# Patient Record
Sex: Male | Born: 2000 | Race: White | Hispanic: No | Marital: Single | Smoking: Never smoker
Health system: Southern US, Community
[De-identification: ages and names within clinical notes are randomized; demographics above are authoritative.]

---
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
## 2021-11-09 ENCOUNTER — Encounter (HOSPITAL_COMMUNITY): Payer: Self-pay | Admitting: Emergency Medicine

## 2021-11-09 ENCOUNTER — Ambulatory Visit (HOSPITAL_COMMUNITY)
Admission: EM | Admit: 2021-11-09 | Discharge: 2021-11-09 | Disposition: A | Discharge status: Home / Self Care | Payer: Managed Care, Other (non HMO) | Attending: Internal Medicine | Admitting: Internal Medicine

## 2021-11-09 ENCOUNTER — Ambulatory Visit (INDEPENDENT_AMBULATORY_CARE_PROVIDER_SITE_OTHER): Payer: Managed Care, Other (non HMO)

## 2021-11-09 DIAGNOSIS — S92352A Displaced fracture of fifth metatarsal bone, left foot, initial encounter for closed fracture: Secondary | ICD-10-CM | POA: Diagnosis not present

## 2021-11-09 NOTE — ED Provider Notes (Signed)
[facility]    CSN: [id number] Arrival date & time: 11/09/21  1449      History   Chief Complaint Chief Complaint  Patient presents with   Foot Pain    HPI Bradley Becker is a 21 y.o. male.   Patient presents with left foot pain after injury that occurred yesterday.  Patient states he tried to jump over his friend when he landed incorrectly on his foot.  Having pain in the lateral portion of the foot.  He is able to bear weight.  Has taken Tylenol for pain with minimal improvement.  Denies any numbness or tingling.   Foot Pain    History reviewed. No pertinent past medical history.  There are no problems to display for this patient.   History reviewed. No pertinent surgical history.     Home Medications    Prior to Admission medications   Not on File    Family History History reviewed. No pertinent family history.  Social History Social History   Tobacco Use   Smoking status: Never   Smokeless tobacco: Current  Vaping Use   Vaping Use: Some days  Substance Use Topics   Alcohol use: Never   Drug use: Never     Allergies   Patient has no known allergies.   Review of Systems Review of Systems Per HPI  Physical Exam Triage Vital Signs ED Triage Vitals  Enc Vitals Group     BP 11/09/21 1536 100/70     Pulse Rate 11/09/21 1536 98     Resp 11/09/21 1536 18     Temp 11/09/21 1536 98.1 F (36.7 C)     Temp Source 11/09/21 1536 Oral     SpO2 11/09/21 1536 98 %     Weight 11/09/21 1537 140 lb (63.5 kg)     Height 11/09/21 1537 5\' 10"  (1.778 m)     Head Circumference --      Peak Flow --      Pain Score 11/09/21 1537 6     Pain Loc --      Pain Edu? --      Excl. in GC? --    No data found.  Updated Vital Signs BP 100/70 (BP Location: Left Arm)   Pulse 98   Temp 98.1 F (36.7 C) (Oral)   Resp 18   Ht 5\' 10"  (1.778 m)   Wt 140 lb (63.5 kg)   SpO2 98%   BMI 20.09 kg/m   Visual Acuity Right Eye Distance:   Left Eye  Distance:   Bilateral Distance:    Right Eye Near:   Left Eye Near:    Bilateral Near:     Physical Exam Constitutional:      General: He is not in acute distress.    Appearance: Normal appearance. He is not toxic-appearing or diaphoretic.  HENT:     Head: Normocephalic and atraumatic.  Eyes:     Extraocular Movements: Extraocular movements intact.     Conjunctiva/sclera: Conjunctivae normal.  Pulmonary:     Effort: Pulmonary effort is normal.  Feet:     Comments: Moderate swelling with associated tenderness to palpation across dorsal surface of foot from approximately third through fifth metatarsal that extends into lateral portion of foot.  Swelling also extends slightly into area directly below ankle.  Associated bruising directly below ankle on the lateral foot.  Patient can wiggle toes.  Has full range of motion of ankle.  Capillary refill and pulses normal.  Neurological:     General: No focal deficit present.     Mental Status: He is alert and oriented to person, place, and time. Mental status is at baseline.  Psychiatric:        Mood and Affect: Mood normal.        Behavior: Behavior normal.        Thought Content: Thought content normal.        Judgment: Judgment normal.      UC Treatments / Results  Labs (all labs ordered are listed, but only abnormal results are displayed) Labs Reviewed - No data to display  EKG   Radiology DG Foot Complete Left  Result Date: 11/09/2021 CLINICAL DATA:  Left foot pain EXAM: LEFT FOOT - COMPLETE 3+ VIEW COMPARISON:  None Available. FINDINGS: Acute minimally displaced avulsion fracture of the fifth metatarsal base with intra-articular extension to the peripheral margin of the fifth MTP joint. No additional fractures. No dislocation. Soft tissue swelling at the dorsal and lateral aspects of the forefoot. IMPRESSION: Acute minimally displaced avulsion fracture of the fifth metatarsal base. Electronically Signed   By: Duanne Guess  D.O.   On: 11/09/2021 16:21    Procedures Procedures (including critical care time)  Medications Ordered in UC Medications - No data to display  Initial Impression / Assessment and Plan / UC Course  I have reviewed the triage vital signs and the nursing notes.  Pertinent labs & imaging results that were available during my care of the patient were reviewed by me and considered in my medical decision making (see chart for details).     Patient has a minimally displaced fracture of the left fifth metatarsal.  Consulted Dr. Sherilyn Dacosta with [facility] given x-ray findings.  He advised a tall cam boot, nonweightbearing, crutches and follow-up with Ortho.  Patient provided with contact information for orthopedist for follow-up on Monday.  Discussed ice application, supportive care, elevation of extremity.  Boot and crutches applied prior to discharge.  Patient offered narcotic pain medication but declined.  Discussed over-the-counter pain relievers with patient.  Discussed return precautions.  Patient verbalized understanding and was agreeable with plan. Final Clinical Impressions(s) / UC Diagnoses   Final diagnoses:  Displaced fracture of fifth metatarsal bone, left foot, initial encounter for closed fracture     Discharge Instructions      You have broken your foot.  A boot and crutches have been supplied.  No weightbearing until otherwise advised by orthopedist.  Recommend ice application and elevation of extremity.  Please follow-up with orthopedist at provided contact information.     ED Prescriptions   None    PDMP not reviewed this encounter.   Gustavus Bryant, [place] 11/09/21 [phone]

## 2021-11-09 NOTE — Discharge Instructions (Signed)
You have broken your foot.  A boot and crutches have been supplied.  No weightbearing until otherwise advised by orthopedist.  Recommend ice application and elevation of extremity.  Please follow-up with orthopedist at provided contact information.

## 2021-11-09 NOTE — ED Triage Notes (Signed)
Patient injured his left foot while jumping over his friend yesterday.  Left foot is somewhat swollen.  Patient has taken Tylenol for pain.

## 2021-11-09 NOTE — ED Notes (Signed)
After trying the crutches patient decided against the crutches, states he has some already at home.
